# Patient Record
Sex: Male | Born: 1955 | Hispanic: Yes | Marital: Single | Smoking: Current every day smoker
Health system: Southern US, Community
[De-identification: ages and names within clinical notes are randomized; demographics above are authoritative.]

## PROBLEM LIST (undated history)

## (undated) HISTORY — PX: OTHER SURGICAL HISTORY: SHX169

---

## 2014-05-04 ENCOUNTER — Emergency Department (HOSPITAL_COMMUNITY): Payer: No Typology Code available for payment source

## 2014-05-04 ENCOUNTER — Encounter (HOSPITAL_COMMUNITY): Payer: Self-pay | Admitting: Emergency Medicine

## 2014-05-04 ENCOUNTER — Emergency Department (HOSPITAL_COMMUNITY)
Admission: EM | Admit: 2014-05-04 | Discharge: 2014-05-05 | Disposition: A | Discharge status: Home / Self Care | Payer: No Typology Code available for payment source | Attending: Emergency Medicine | Admitting: Emergency Medicine

## 2014-05-04 DIAGNOSIS — R109 Unspecified abdominal pain: Secondary | ICD-10-CM | POA: Insufficient documentation

## 2014-05-04 DIAGNOSIS — F17200 Nicotine dependence, unspecified, uncomplicated: Secondary | ICD-10-CM | POA: Insufficient documentation

## 2014-05-04 DIAGNOSIS — R1110 Vomiting, unspecified: Secondary | ICD-10-CM | POA: Insufficient documentation

## 2014-05-04 DIAGNOSIS — Z87828 Personal history of other (healed) physical injury and trauma: Secondary | ICD-10-CM | POA: Insufficient documentation

## 2014-05-04 LAB — COMPREHENSIVE METABOLIC PANEL
ALT: 31 U/L (ref 0–53)
ANION GAP: 18 — AB (ref 5–15)
AST: 27 U/L (ref 0–37)
Albumin: 4.6 g/dL (ref 3.5–5.2)
Alkaline Phosphatase: 89 U/L (ref 39–117)
BUN: 13 mg/dL (ref 6–23)
CALCIUM: 10.3 mg/dL (ref 8.4–10.5)
CO2: 23 meq/L (ref 19–32)
CREATININE: 0.74 mg/dL (ref 0.50–1.35)
Chloride: 97 mEq/L (ref 96–112)
Glucose, Bld: 105 mg/dL — ABNORMAL HIGH (ref 70–99)
Potassium: 4.2 mEq/L (ref 3.7–5.3)
Sodium: 138 mEq/L (ref 137–147)
Total Bilirubin: 1 mg/dL (ref 0.3–1.2)
Total Protein: 8.3 g/dL (ref 6.0–8.3)

## 2014-05-04 LAB — CBC WITH DIFFERENTIAL/PLATELET
BASOS ABS: 0 10*3/uL (ref 0.0–0.1)
Basophils Relative: 0 % (ref 0–1)
EOS PCT: 2 % (ref 0–5)
Eosinophils Absolute: 0.3 10*3/uL (ref 0.0–0.7)
HEMATOCRIT: 46.4 % (ref 39.0–52.0)
Hemoglobin: 16.4 g/dL (ref 13.0–17.0)
LYMPHS PCT: 20 % (ref 12–46)
Lymphs Abs: 3 10*3/uL (ref 0.7–4.0)
MCH: 29.9 pg (ref 26.0–34.0)
MCHC: 35.3 g/dL (ref 30.0–36.0)
MCV: 84.5 fL (ref 78.0–100.0)
MONOS PCT: 5 % (ref 3–12)
Monocytes Absolute: 0.8 10*3/uL (ref 0.1–1.0)
Neutro Abs: 10.8 10*3/uL — ABNORMAL HIGH (ref 1.7–7.7)
Neutrophils Relative %: 73 % (ref 43–77)
Platelets: 304 10*3/uL (ref 150–400)
RBC: 5.49 MIL/uL (ref 4.22–5.81)
RDW: 13.9 % (ref 11.5–15.5)
WBC: 15 10*3/uL — AB (ref 4.0–10.5)

## 2014-05-04 LAB — LIPASE, BLOOD: LIPASE: 23 U/L (ref 11–59)

## 2014-05-04 NOTE — ED Notes (Signed)
Pt. reports mid abdominal pain with nausea and vomitting ( x4 ) onset this morning , denies diarrhea/fever or chills.

## 2014-05-04 NOTE — ED Notes (Signed)
Patient transported to X-ray 
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
## 2014-05-05 NOTE — ED Notes (Signed)
MD at bedside. 

## 2014-05-05 NOTE — Discharge Instructions (Signed)
Dieta de lquidos claros (Clear Liquid Diet)  Stay on clear liquids for the next 24 hours. Return if your pain worsens or if unable to hold down fluids without vomiting. Call the wellness Center or any of the numbers on the resource guide to get a primary care physician Neomia DearUna dieta de lquidos claros es una dieta a corto plazo que se indica para Media plannerproporcionar el lquido y la energa bsica que se necesitan cuando no puede ingerir otra cosa. La dieta de lquidos claros consiste en lquidos o slidos que se transforman en lquidos a Publishing rights managertemperatura ambiente. Debe poder ver a travs del lquido. Hay varios motivos por los que se lo puede limitar a lquidos claros, por ejemplo:  Cuando tiene una enfermedad repentina (aguda) antes o despus de [place]una ciruga.  Para ayudar al cuerpo a volver a adaptarse lentamente a los alimentos despus de un perodo prolongado sin poder consumirlos.  Reemplazo de lquidos cuando tiene una enfermedad diarreica.  Cuando se Nature conservation officerle realizarn determinados estudios, como una colonoscopa, en la que se insertan instrumentos dentro del cuerpo para observar las partes del Kirwinsistema digestivo. QU PUEDO INGERIR? Una dieta de lquidos claros no proporciona todos los nutrientes que usted necesita. Es importante escoger una variedad de las siguientes opciones para obtener la mayor cantidad posible de nutrientes.  Jugos de vegetales sin pulpa.  Jugos de frutas y bebidas preparadas con frutas sin pulpa.  Caf (comn o descafeinado), t o gaseosa segn el criterio de su mdico.  Caldos claros o sopas a base de caldos.  Gelatinas saborizadas y ricas en protenas.  Azcar o miel.  Sorbetes o helados de agua que no ConocoPhillipscontengan leche. Si no est seguro de si puede consumir algunos lquidos, debe preguntarle a su mdico. Tambin puede preguntarle al mdico si hay otras opciones de lquidos claros. Document Released: 10/20/2005 Document Revised: 10/25/2013 [facility] Patient Information 2015  [place]ExitCare, [place]LLC. This information is not intended to replace advice given to you by your health care provider. Make sure you discuss any questions you have with your health care provider. Dolor abdominal (Abdominal Pain) El dolor puede tener muchas causas. Normalmente la causa del dolor abdominal no es una enfermedad y Scientist, clinical (histocompatibility and immunogenetics)mejorar sin TEFL teachertratamiento. Frecuentemente puede controlarse y tratarse en casa. Su mdico le Medical sales representativerealizar un examen fsico y posiblemente solicite anlisis de sangre y radiografas para ayudar a Chief Strategy Officerdeterminar la gravedad de su dolor. Sin embargo, en IAC/InterActiveCorpmuchos casos, debe transcurrir ms tiempo antes de que se pueda Clinical research associateencontrar una causa evidente del dolor. Antes de llegar a ese punto, es posible que su mdico no sepa si necesita ms pruebas o un tratamiento ms profundo. INSTRUCCIONES PARA EL CUIDADO EN EL HOGAR  Est atento al dolor para ver si hay cambios. Las siguientes indicaciones ayudarn a Architectural technologistaliviar cualquier molestia que pueda sentir:  Hesterome solo medicamentos de venta libre o recetados, segn las indicaciones del mdico.  No tome laxantes a menos que se lo haya indicado su mdico.  Pruebe con Neomia Dearuna dieta lquida absoluta (caldo, t o agua) segn se lo indique su mdico. Introduzca gradualmente una dieta normal, segn su tolerancia. SOLICITE ATENCIN MDICA SI:  Tiene dolor abdominal sin explicacin.  Tiene dolor abdominal relacionado con nuseas o diarrea.  Tiene dolor cuando orina o defeca.  Experimenta dolor abdominal que lo despierta de noche.  Tiene dolor abdominal que empeora o mejora cuando come alimentos.  Tiene dolor abdominal que empeora cuando come alimentos grasosos.  Tiene fiebre. SOLICITE ATENCIN MDICA DE INMEDIATO SI:   El dolor no  desaparece en un plazo mximo de 2horas.  No deja de (vomitar).  El Engineer, miningdolor se siente solo en partes del abdomen, como el lado derecho o la parte inferior izquierda del abdomen.  Evaca materia fecal sanguinolenta o negra, de aspecto  alquitranado. ASEGRESE DE QUE:  Comprende estas instrucciones.  Controlar su afeccin.  Recibir ayuda de inmediato si no mejora o si empeora. Document Released: 10/20/2005 Document Revised: 10/25/2013 [facility] Patient Information 2015 [place] OakExitCare, MarylandLLC. This information is not intended to replace advice given to you by your health care provider. Make sure you discuss any questions you have with your health care provider.  Emergency Department Resource Guide 1) Find a Doctor and Pay Out of Pocket Although you won't have to find out who is covered by your insurance plan, it is a good idea to ask around and get recommendations. You will then need to call the office and see if the doctor you have chosen will accept you as a new patient and what types of options they offer for patients who are self-pay. Some doctors offer discounts or will set up payment plans for their patients who do not have insurance, but you will need to ask so you aren't surprised when you get to your appointment.  2) Contact Your Local Health Department Not all health departments have doctors that can see patients for sick visits, but many do, so it is worth a call to see if yours does. If you don't know where your local health department is, you can check in your phone book. The CDC also has a tool to help you locate your state's health department, and many state websites also have listings of all of their local health departments.  3) Find a Walk-in Clinic If your illness is not likely to be very severe or complicated, you may want to try a walk in clinic. These are popping up all over the country in pharmacies, drugstores, and shopping centers. They're usually staffed by nurse practitioners or physician assistants that have been trained to treat common illnesses and complaints. They're usually fairly quick and inexpensive. However, if you have serious medical issues or chronic medical problems, these are probably not your  best option.  No Primary Care Doctor: - Call Health Connect at  [phone] - they can help you locate a primary care doctor that  accepts your insurance, provides certain services, etc. - Physician Referral Service- [phone]  Chronic Pain Problems: Organization         Address  Phone   Notes  [facility]  [phone] Patients need to be referred by their primary care doctor.   Medication Assistance: Organization         Address  Phone   Notes  [facility] [address] [place], [place] [address] [phone] --Must be a resident of [facility] -- Must have NO insurance coverage whatsoever (no Medicaid/ Medicare, etc.) -- The pt. MUST have a primary care doctor that directs their care regularly and follows them in the community   MedAssist  [phone]   [employer]  [phone]    Agencies that provide inexpensive medical care: Organization         Address  Phone   Notes  [facility]  [phone]   [facility]    [phone]   [facility] [address] [place], [place] [address] [phone]  [facility] [address] [place] [phone]   Planned Parenthood    [phone]   [facility]    [phone]   [place] [address], [place] Phone:  [phone], Fax:  [phone] Hours of Operation:  9 am - 6 pm, M-F.  Also accepts Medicaid/Medicare and self-pay.  [facility] for [place], [address], [place] Phone: [phone], Fax: [phone]. Hours of Operation:  8:30 am - 5:30 pm, M-F.  Also accepts Medicaid and self-pay.  [facility] [place] [address] [place] Phone: [phone]   [place], [place], [place] [phone], Ext. [phone] Mondays & Thursdays: 7-9 AM.  First 15  patients are seen on a first come, first serve basis.    [place] Providers:  Organization         Address  Phone   Notes  [facility] [address],  [phone] Also accepts self-pay patients.  [facility] [address], [place]  [phone]   [place], [address], [place] [phone]   [facility] [address], [place] [phone]   Lucianne Lei [address], [place]   [phone] Only accepts [place] Access [place] patients after they have their name applied to their card.   Self-Pay (no insurance) in [facility]:  Organization         Address  Phone   Notes  Sickle Cell Patients, [facility] [place] [phone]   [facility] Urgent Care [place] [phone]   [facility] [place]  [place], [place], [place] [phone]   [facility]/Dr. Osei-Bonsu  [address] [place] or [address], [place] [phone] Phone number for both [place] and [place] locations is the same.  [facility] [address], [place] [phone]   [facility] [address], [place] or [address] [phone]   [facility] [address], [place] [phone], phone; [phone], fax Sees patients 1st and 3rd Saturday of every month.  Must not qualify for public or private insurance (i.e. Medicaid, Medicare, [place] Health Choice, Veterans' Benefits)  Household income should be no more than 200% of the poverty level The clinic cannot treat you if you are pregnant or think you are pregnant  Sexually transmitted diseases are not treated at the clinic.    Dental  Care: Organization         Address  Phone  Notes  [facility] of [facility] [place] [phone] Accepts children up to age 21 who are enrolled in [place] or [place]; pregnant women with a Medicaid card; and children who have applied for Medicaid or [place] Health Choice, but were declined, whose parents can pay a reduced fee at time of service.  [facility] Department of [facility]  [address], [place] [phone] Accepts children up to age 93 who are enrolled in [place] or [place]; pregnant women with a Medicaid card; and  children who have applied for Medicaid or [place] Health Choice, but were declined, whose parents can pay a reduced fee at time of service.  [place] Adult Dental Access PROGRAM  [place] [phone] Patients are seen by appointment only. Walk-ins are not accepted. [place] will see patients 42 years of age and older. Monday - Tuesday (8am-5pm) Most Wednesdays (8:30-5pm) $30 per visit, cash only  [facility] Adult Dental Access PROGRAM  [address], [facility] [phone] Patients are seen by appointment only. Walk-ins are not accepted. [place] will see patients 59 years of age and older. One Wednesday Evening (Monthly: Volunteer Based).  $30 per visit, cash only  [place]  [phone] for adults; Children under age 28, call Graduate Pediatric Dentistry at [phone]. Children aged 58-14, please call [phone] to request a pediatric application.  Dental services are provided in all areas of dental care including fillings, crowns and bridges, complete and partial dentures, implants, gum treatment, root canals, and extractions. Preventive care is also provided. Treatment is provided to both adults and children. Patients are selected via a lottery and there is often a waiting list.   [facility] [address], [place]  [phone] [email or web address]   [facility] [address] [place], [place] [phone], Ext. [phone] Second and Fourth Thursday of each month, opens at 6:30 AM; Clinic ends at 9 AM.  Patients are seen on a first-come first-served basis, and a limited number are seen during each clinic.   [facility]  [address] [place], [place] [phone]   Eligibility Requirements You must have lived in [place], [place], or [place] counties for at least the last three months.   You cannot be eligible for state or federal sponsored [employer], including [employer], [place], or [employer].   You generally cannot be eligible for healthcare insurance through your employer.    How to apply: Eligibility screenings are held every Tuesday and Wednesday afternoon from 1:00 pm until 4:00 pm. You do not need an appointment for the interview!  [facility] [address] [place], [place]   [place]  [place] Department  [place]  [phone]    Behavioral Health Resources in the Community: Intensive Outpatient Programs Organization         Address  Phone  Notes  [place]. [address] [place], [place] [phone]   [facility] [address], [place], [place]   ADS: [facility] Drug [facility] [address] [place], [place]   [place] [address],  [place], [place] or [phone]   Substance Abuse Resources Organization         Address  Phone  Notes  Alcohol and Drug Services  [phone]   [place]  [phone]   [employer]   Chinita Pester  [phone]   Residential & Outpatient Substance Abuse Program  [phone]    Psychological Services Organization         Address  Phone  Notes  [facility] [place]  [place]  [phone]   [place] [address] [place] or [phone]    Mobile Crisis Teams Organization  Address  Phone  Notes  [facility], Mobile Crisis Care Unit  [phone]   [facility]  [address] [place], [place] [phone]   [facility] [address] [place] [phone]    Self-Help/Support Groups Organization         Address  Phone             Notes  [facility] [place] - variety of support groups  [phone] Call for more information  Narcotics Anonymous (NA), [facility] [address], [employer] [place]  2 meetings at this location   Statistician         Address  Phone  Notes  [facility] [address],    [place]  [phone]   [facility]  [address], [place] [address], [place], [place] [phone]   [facility] [address] [place], [place] [phone] Admissions: 8am-3pm M-F  [facility] [address]    [place], [place] [phone]   [facility] [address] [place], [place], [place] [phone]   [facility] [address],  [place], [place] [phone]   [facility] - Intensive Outpatient [address], [place], [place] [phone]   [facility] (Addiction Recovery [facility] [address] [place].,  [place], [place] [phone] or [phone]   [facility] ([facility]) [address] [place], [place] [phone] Accepts Medicaid  [facility] [place] [address],  [place] [phone] Substance Abuse/Addiction Treatment   [facility] Organization         Address  Phone  Notes  [facility]  [phone]   Angie Fava, PhD [address] Ervin Knack [place], [place]   [phone] or [phone]   [facility]al   [address] [place], [place] [phone]   [facility] [address], [place], [place] [phone] Insurance/Medicaid/sponsorship through [facility] and Families [address]                                    [place], [place] [phone] Therapy/tele-psych/case  [facility] [address] [place], [place] [phone]    Dr. Lolly Mustache  [phone]   [facility] [place]  United Way [facility] Dept. 1) [address] [place] 2) [address] [place] 3)  [address], [place] [phone]  [phone]   [facility] Child Abuse Hotline 8652649690 or [phone] (After Hours)

## 2014-05-23 ENCOUNTER — Ambulatory Visit: Payer: No Typology Code available for payment source | Admitting: Family Medicine
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
